# Patient Record
Sex: Female | Born: 1975 | Race: White | Hispanic: No | Marital: Single | Smoking: Current every day smoker
Health system: Southern US, Community
[De-identification: ages and names within clinical notes are randomized; demographics above are authoritative.]

---

## 2016-06-06 ENCOUNTER — Emergency Department
Admission: EM | Admit: 2016-06-06 | Discharge: 2016-06-06 | Disposition: A | Discharge status: Home / Self Care | Payer: Self-pay | Attending: Emergency Medicine | Admitting: Emergency Medicine

## 2016-06-06 ENCOUNTER — Emergency Department: Payer: Self-pay

## 2016-06-06 ENCOUNTER — Encounter: Payer: Self-pay | Admitting: *Deleted

## 2016-06-06 DIAGNOSIS — J069 Acute upper respiratory infection, unspecified: Secondary | ICD-10-CM | POA: Insufficient documentation

## 2016-06-06 DIAGNOSIS — B9789 Other viral agents as the cause of diseases classified elsewhere: Secondary | ICD-10-CM

## 2016-06-06 MED ORDER — HYDROCOD POLST-CPM POLST ER 10-8 MG/5ML PO SUER: 5.0000 mL | Freq: Two times a day (BID) | ORAL | 0 refills | Status: DC

## 2016-06-06 MED ORDER — BENZONATATE 100 MG PO CAPS
ORAL_CAPSULE | ORAL | Status: AC
  Filled 2016-06-06: qty 1

## 2016-06-06 MED ORDER — BENZONATATE 100 MG PO CAPS
200.0000 mg | ORAL_CAPSULE | Freq: Once | ORAL | Status: AC
  Administered 2016-06-06: 200 mg via ORAL
  Filled 2016-06-06: qty 2

## 2016-06-06 MED ORDER — KETOROLAC TROMETHAMINE 60 MG/2ML IM SOLN
60.0000 mg | Freq: Once | INTRAMUSCULAR | Status: AC
  Administered 2016-06-06: 60 mg via INTRAMUSCULAR
  Filled 2016-06-06: qty 2

## 2016-06-06 MED ORDER — NAPROXEN 500 MG PO TABS: 500.0000 mg | ORAL_TABLET | Freq: Two times a day (BID) | ORAL | Status: DC

## 2016-06-06 NOTE — ED Triage Notes (Signed)
States nasal congestion and sore throat for 1 month, states she now has a green productive cough, daily smoker, awake and alert in no acute distress

## 2016-06-06 NOTE — ED Provider Notes (Signed)
[facility] Emergency Department Provider Note   ____________________________________________   First MD Initiated Contact with Patient 06/06/16 1325     (approximate)  I have reviewed the triage vital signs and the nursing notes.   HISTORY  Chief Complaint Cough    HPI Barbara Anthony is a 41 y.o. female patient complaining of one month of intermittent nasal congestion, sore throat and cough. Patient stating the past 3 days of cough has become more productive and greenish in nature. Patient admits to cigarette smoking. Patient states body aches secondary to continue coughing. Patient denies any nausea, vomiting, or diarrhea.   History reviewed. No pertinent past medical history.  There are no active problems to display for this patient.   No past surgical history on file.  Prior to Admission medications   Medication Sig Start Date End Date Taking? Authorizing Provider  chlorpheniramine-HYDROcodone (TUSSIONEX PENNKINETIC ER) 10-8 MG/5ML SUER Take 5 mLs by mouth 2 (two) times daily. 06/06/16   Joni Reining, PA-C  naproxen (NAPROSYN) 500 MG tablet Take 1 tablet (500 mg total) by mouth 2 (two) times daily with a meal. 06/06/16   Joni Reining, PA-C    Allergies Patient has no known allergies.  History reviewed. No pertinent family history.  Social History Social History  Substance Use Topics  . Smoking status: Not on file  . Smokeless tobacco: Not on file  . Alcohol use Not on file    Review of Systems  Constitutional:  fever or chills  Eyes: No visual changes. ENT: Sore throat  Cardiovascular: Denies chest pain. Respiratory: Denies shortness of breath.Productive cough Gastrointestinal: No abdominal pain.  No nausea, no vomiting.  No diarrhea.  No constipation. Genitourinary: Negative for dysuria. Musculoskeletal: Negative for back pain. Skin: Negative for rash. Neurological: Negative for headaches, focal weakness or  numbness.   ____________________________________________   PHYSICAL EXAM:  VITAL SIGNS: ED Triage Vitals [06/06/16 1253]  Enc Vitals Group     BP (!) 131/99     Pulse Rate 98     Resp 16     Temp 98.3 F (36.8 C)     Temp Source Oral     SpO2 97 %     Weight 165 lb (74.8 kg)     Height 5\' 6"  (1.676 m)     Head Circumference      Peak Flow      Pain Score      Pain Loc      Pain Edu?      Excl. in GC?     Constitutional: Alert and oriented. Well appearing and in no acute distress. Eyes: Conjunctivae are normal. PERRL. EOMI. Head: Atraumatic. Nose: No congestion/rhinnorhea. Mouth/Throat: Mucous membranes are moist.  Oropharynx non-erythematous. Neck: No stridor.  No cervical spine tenderness to palpation. Hematological/Lymphatic/Immunilogical: No cervical lymphadenopathy. Cardiovascular: Normal rate, regular rhythm. Grossly normal heart sounds.  Good peripheral circulation. Respiratory: Normal respiratory effort.  No retractions. Lungs CTAB. Productive cough Gastrointestinal: Soft and nontender. No distention. No abdominal bruits. No CVA tenderness. Musculoskeletal: No lower extremity tenderness nor edema.  No joint effusions. Neurologic:  Normal speech and language. No gross focal neurologic deficits are appreciated. No gait instability. Skin:  Skin is warm, dry and intact. No rash noted. Psychiatric: Mood and affect are normal. Speech and behavior are normal.  ____________________________________________   LABS (all labs ordered are listed, but only abnormal results are displayed)  Labs Reviewed - No data to display ____________________________________________  EKG  ____________________________________________  RADIOLOGY  No acute final chest x-ray ____________________________________________   PROCEDURES  Procedure(s) performed: None  Procedures  Critical Care performed: No  ____________________________________________   INITIAL IMPRESSION /  ASSESSMENT AND PLAN / ED COURSE  Pertinent labs & imaging results that were available during my care of the patient were reviewed by me and considered in my medical decision making (see chart for details).  Respiratory infection with cough. Patient given discharge care instructions. Patient given a work note. Patient [facility]      ____________________________________________   FINAL CLINICAL IMPRESSION(S) / ED DIAGNOSES  Final diagnoses:  Viral URI with cough      NEW MEDICATIONS STARTED DURING THIS VISIT:  New Prescriptions   CHLORPHENIRAMINE-HYDROCODONE (TUSSIONEX PENNKINETIC ER) 10-8 MG/5ML SUER    Take 5 mLs by mouth 2 (two) times daily.   NAPROXEN (NAPROSYN) 500 MG TABLET    Take 1 tablet (500 mg total) by mouth 2 (two) times daily with a meal.     Note:  This document was prepared using Dragon voice recognition software and may include unintentional dictation errors.    Joni ReiningSmith, Ronald K, PA-C 06/06/16 1430    Emily FilbertWilliams, Jonathan E, MD 06/06/16 [phone]

## 2019-01-05 IMAGING — CR DG CHEST 2V
1 series · 2 of 2 positions shown · non-contrast
Comparison: 09/07/2007

CLINICAL DATA: Productive cough 1 month

EXAM:
CHEST  2 VIEW

[Series 1: dg chest 2 view · 0.14mm/px · 2 of 2 slices shown]
[im 1/2]
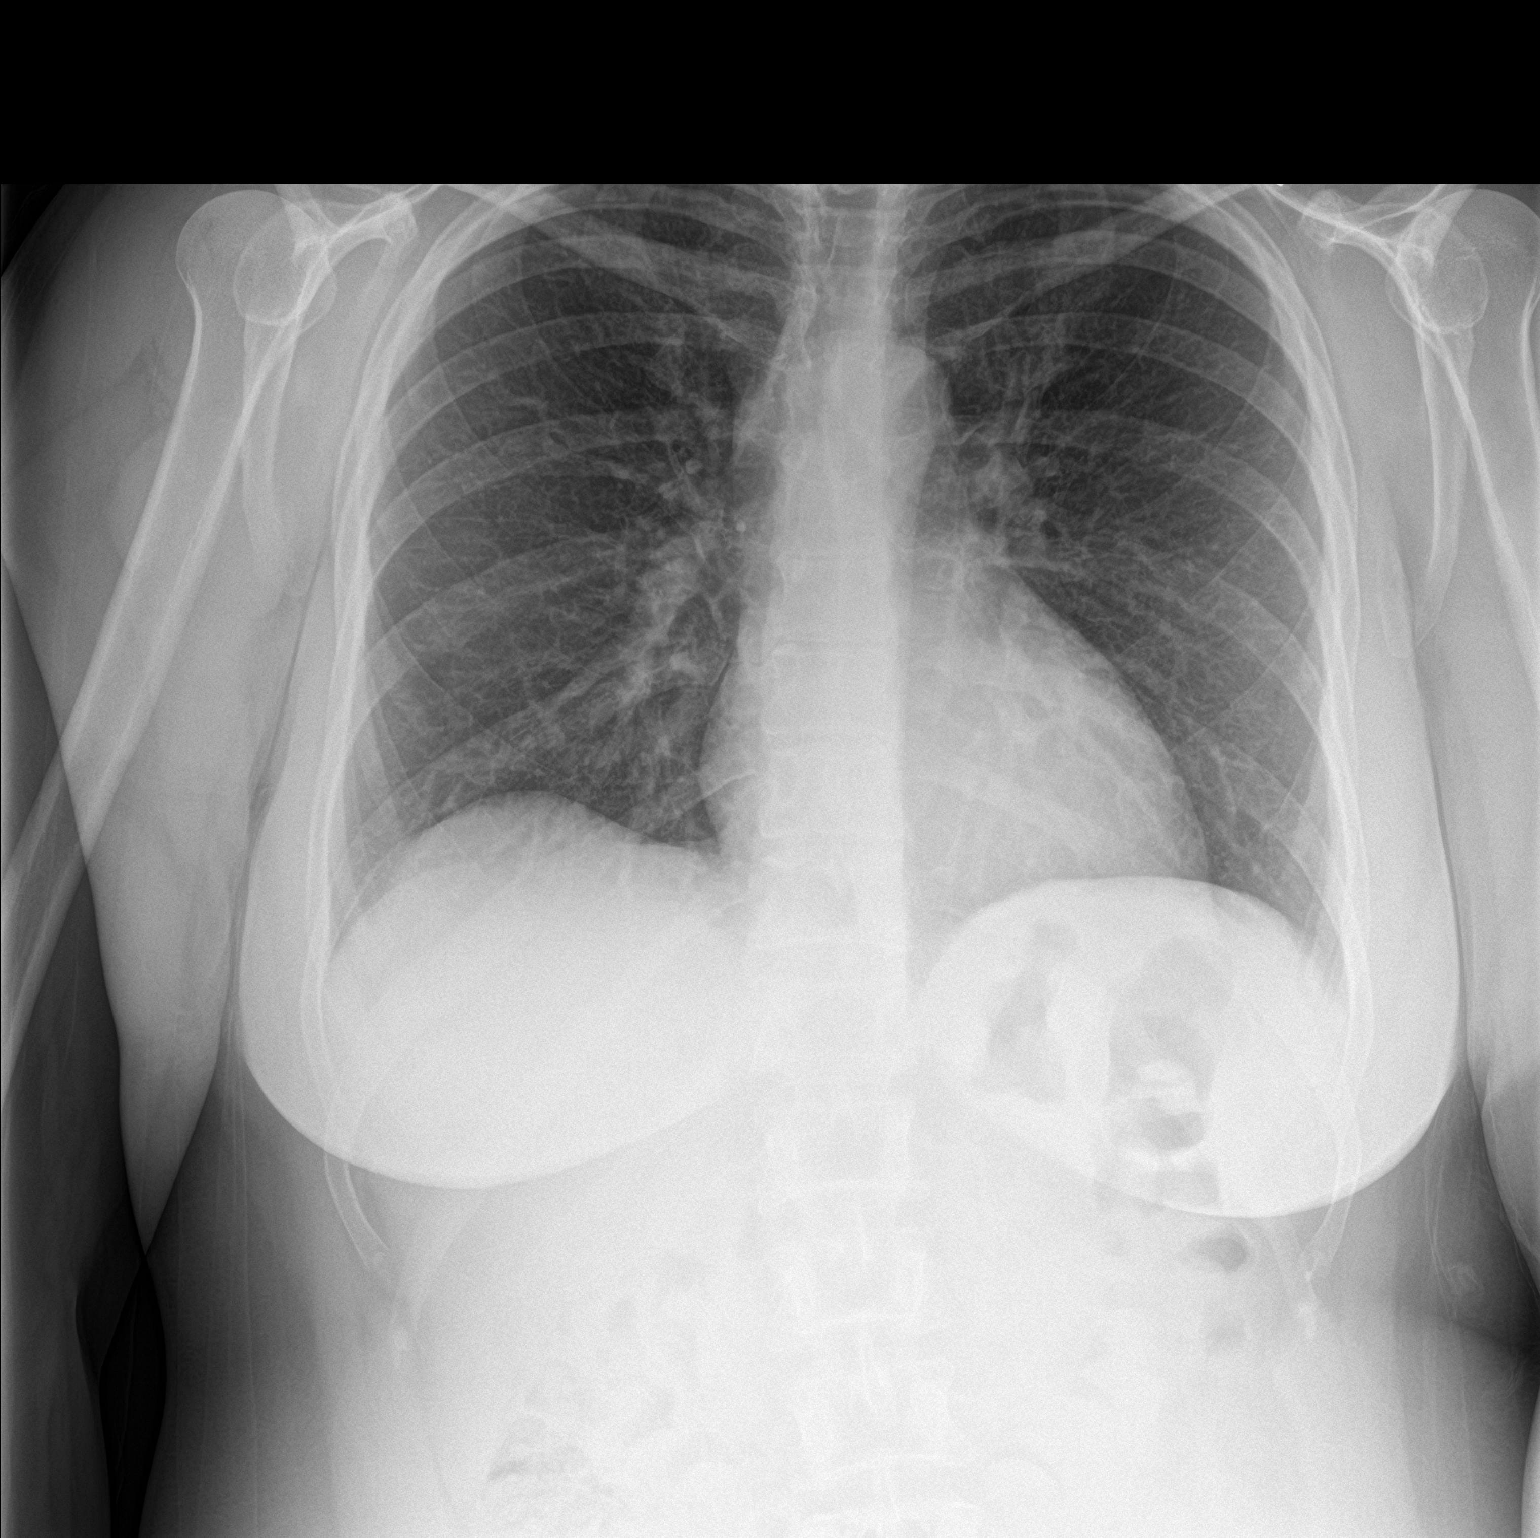
[im 2/2]
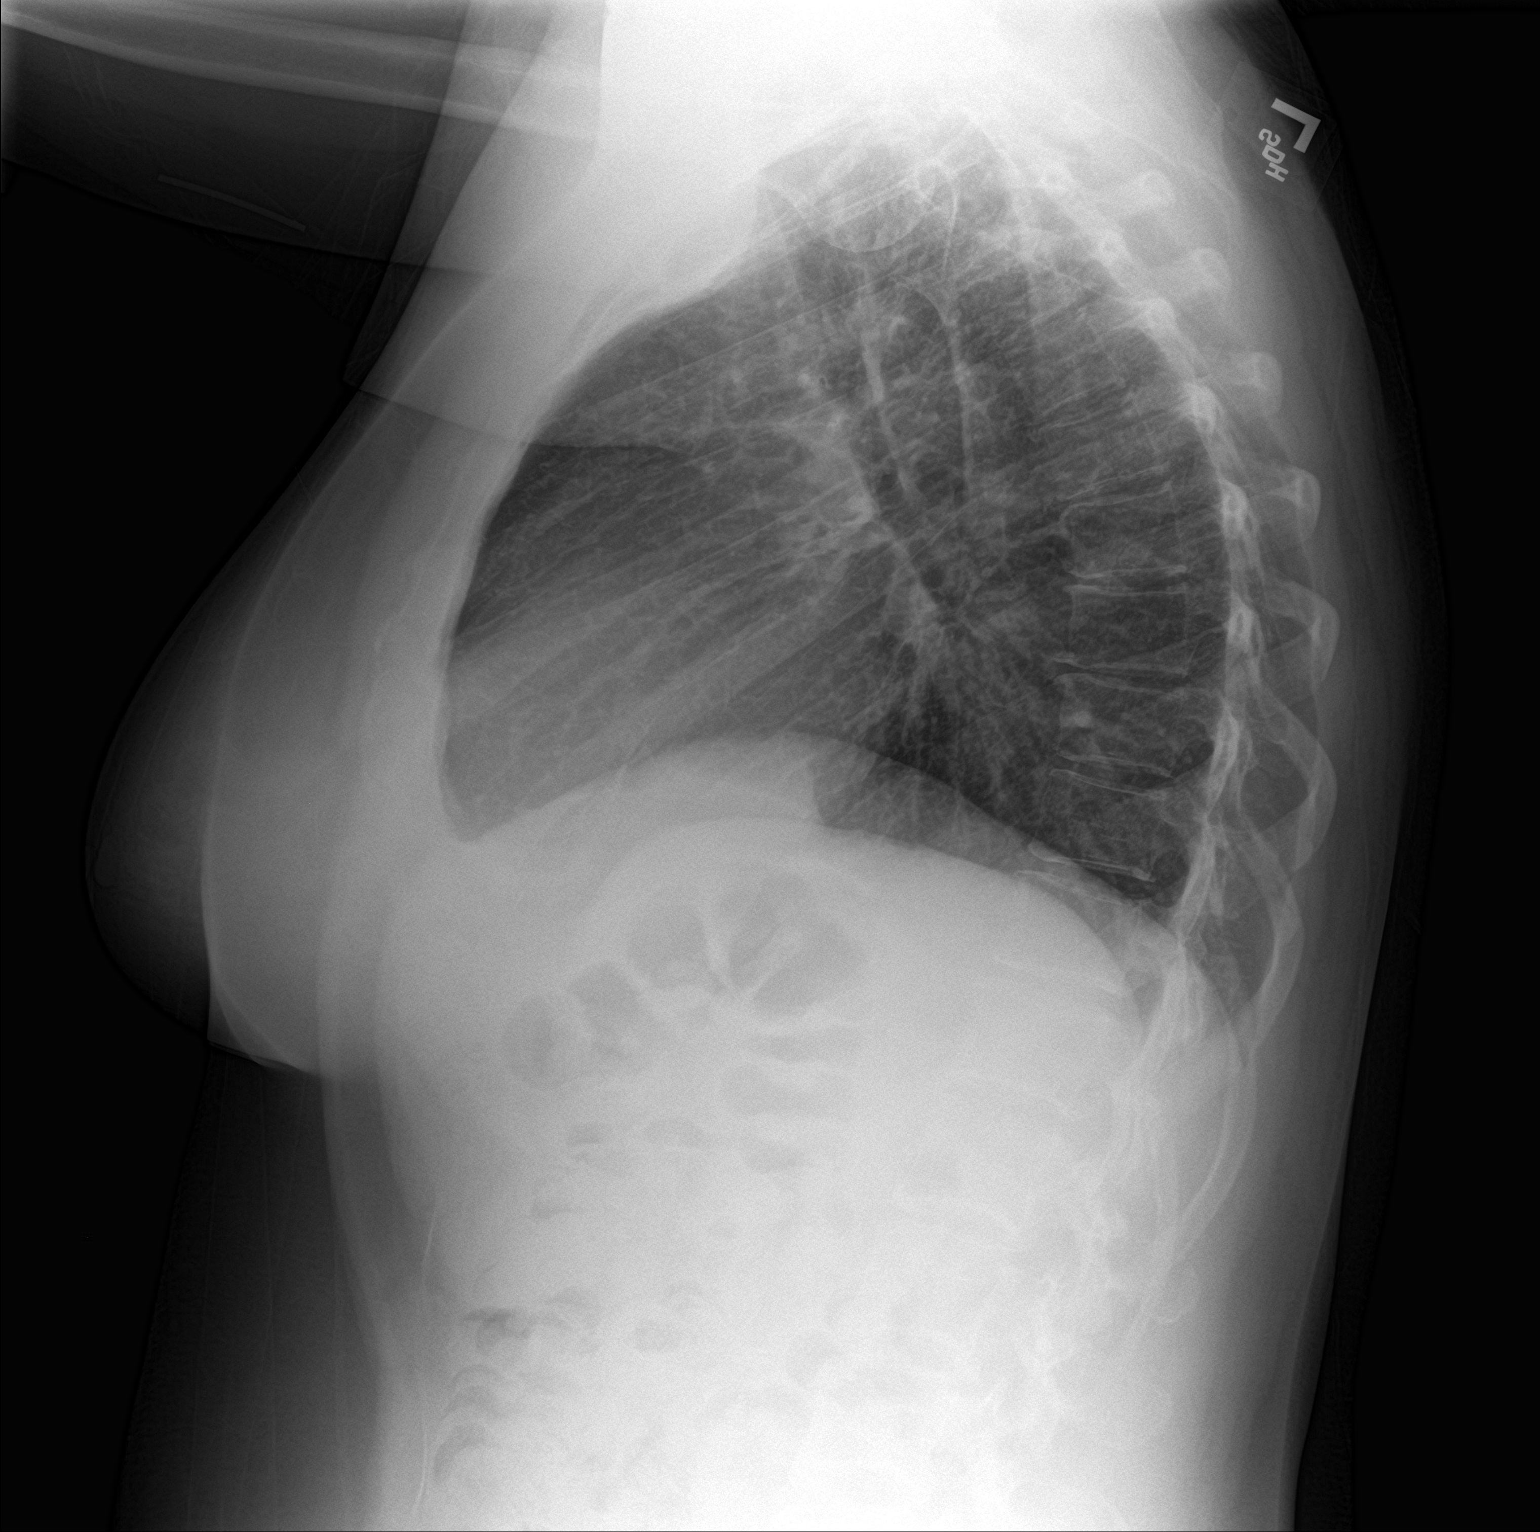

[2 of 2 positions shown; findings below may reference images not displayed]

FINDINGS: The heart size and mediastinal contours are within normal limits.
Both lungs are clear. The visualized skeletal structures are
unremarkable.
IMPRESSION: No active cardiopulmonary disease.

## 2019-09-23 ENCOUNTER — Other Ambulatory Visit: Payer: Self-pay

## 2019-09-23 DIAGNOSIS — Z20822 Contact with and (suspected) exposure to covid-19: Secondary | ICD-10-CM

## 2019-09-26 LAB — NOVEL CORONAVIRUS, NAA: SARS-CoV-2, NAA: NOT DETECTED

## 2021-06-20 DIAGNOSIS — M25462 Effusion, left knee: Secondary | ICD-10-CM | POA: Insufficient documentation

## 2021-08-10 DIAGNOSIS — M179 Osteoarthritis of knee, unspecified: Secondary | ICD-10-CM | POA: Insufficient documentation

## 2021-08-10 DIAGNOSIS — S83249A Other tear of medial meniscus, current injury, unspecified knee, initial encounter: Secondary | ICD-10-CM | POA: Insufficient documentation

## 2022-04-11 ENCOUNTER — Encounter: Payer: Self-pay | Admitting: Podiatry

## 2022-04-11 ENCOUNTER — Ambulatory Visit (INDEPENDENT_AMBULATORY_CARE_PROVIDER_SITE_OTHER): Payer: BC Managed Care – PPO

## 2022-04-11 ENCOUNTER — Ambulatory Visit: Payer: BC Managed Care – PPO | Admitting: Podiatry

## 2022-04-11 DIAGNOSIS — M21612 Bunion of left foot: Secondary | ICD-10-CM | POA: Diagnosis not present

## 2022-04-11 DIAGNOSIS — M21619 Bunion of unspecified foot: Secondary | ICD-10-CM

## 2022-04-11 NOTE — Progress Notes (Signed)
  Subjective:  Patient ID: Barbara Anthony, female    DOB: 02-Jun-1975,   MRN: [id number]  Chief Complaint  Patient presents with   Bunions    Bunion to left foot     47 y.o. female presents for concern of bunion on her left foot that has been there for years. Relates over the years the pain has started to worsen. Does relates she is not in so much pain that she feels like she wants surgery yet . Denies any other pedal complaints. Denies n/v/f/c.   History reviewed. No pertinent past medical history.  Objective:  Physical Exam: Vascular: DP/PT pulses 2/4 bilateral. CFT <3 seconds. Normal hair growth on digits. No edema.  Skin. No lacerations or abrasions bilateral feet.  Musculoskeletal: MMT 5/5 bilateral lower extremities in DF, PF, Inversion and Eversion. Deceased ROM in DF of ankle joint. HAV deformity noted at the left foot with tenderness to the medial eminence. Mild increase in first ray mobility. No pain with ROM of the first MPJ. No pain dorsally or plantarly to the joint.  Neurological: Sensation intact to light touch.   Assessment:   1. Bunion, left foot      Plan:  Patient was evaluated and treated and all questions answered. -Xrays reviewed. Moderative bunion deformity noted with HAV angle of about 13 degrees.  -Discussed HAV and treatment options;conservative and surgical management; risks, benefits, alternatives discussed. All patient's questions answered. -Discussed padding and wide shoe gear.   -Powersteps dispensed  -Recommend continue with good supportive shoes and inserts.  -Discusssed CMO and patient will check with insurance.  -Discussed surgical options. Will hold off until painful enough if ever.   -Patient to return to office as needed or sooner if condition worsens.   Lorenda Peck, DPM

## 2022-05-03 DIAGNOSIS — M5416 Radiculopathy, lumbar region: Secondary | ICD-10-CM | POA: Insufficient documentation

## 2022-05-03 DIAGNOSIS — M1611 Unilateral primary osteoarthritis, right hip: Secondary | ICD-10-CM | POA: Insufficient documentation

## 2022-05-03 DIAGNOSIS — R52 Pain, unspecified: Secondary | ICD-10-CM | POA: Insufficient documentation

## 2022-05-03 DIAGNOSIS — M25551 Pain in right hip: Secondary | ICD-10-CM | POA: Insufficient documentation

## 2022-10-17 ENCOUNTER — Encounter: Payer: Self-pay | Admitting: Podiatry

## 2022-10-17 ENCOUNTER — Ambulatory Visit: Payer: BC Managed Care – PPO | Admitting: Podiatry

## 2022-10-17 DIAGNOSIS — M2012 Hallux valgus (acquired), left foot: Secondary | ICD-10-CM | POA: Diagnosis not present

## 2022-10-17 DIAGNOSIS — Z720 Tobacco use: Secondary | ICD-10-CM | POA: Insufficient documentation

## 2022-10-17 DIAGNOSIS — M461 Sacroiliitis, not elsewhere classified: Secondary | ICD-10-CM | POA: Insufficient documentation

## 2022-10-17 DIAGNOSIS — M5441 Lumbago with sciatica, right side: Secondary | ICD-10-CM | POA: Insufficient documentation

## 2022-10-17 DIAGNOSIS — M21612 Bunion of left foot: Secondary | ICD-10-CM | POA: Diagnosis not present

## 2022-10-17 DIAGNOSIS — G5701 Lesion of sciatic nerve, right lower limb: Secondary | ICD-10-CM | POA: Insufficient documentation

## 2022-10-17 DIAGNOSIS — M533 Sacrococcygeal disorders, not elsewhere classified: Secondary | ICD-10-CM | POA: Insufficient documentation

## 2022-10-17 NOTE — Progress Notes (Signed)
Chief Complaint  Patient presents with   Bunions    Bunion to left foot. Throbbing pains at night time. She is wearing flip flops because close toe shoes cause a lot of pain and pressure.    HPI: 47 y.o. female presents today with c/o left bunion pain.  She was seen back in March, but stated it didn't hurt enough at that time to proceed with surgery.  She is now noting bump pain all the time, and cannot get relief.  The pain is progressively worsening.  The meloxicam she takes for her back isn't helping her bunion pain.  She denies any big changes in bunion or hallux position since last seen.  No injuries reported.  She is now interested in surgical correction.    States that she doesn't have bad reactions to anesthesia.  She notes she did "flatline" when given an epidural in the past, but states it had something to do with her liver at that time (pregnancy) and has never had any issues since then.  She is a Engineer, civil (consulting).  History reviewed. No pertinent past medical history.  History reviewed. No pertinent surgical history.  No Known Allergies   Physical Exam: General: The patient is alert and oriented x3 in no acute distress.  Dermatology: Skin is warm, dry and supple bilateral lower extremities. Interspaces are clear of maceration and debris.  Toenails are elongated  Vascular: Palpable pedal pulses bilaterally. Capillary refill within normal limits.  No appreciable edema.  No erythema or calor.  Neurological: Light touch sensation grossly intact bilateral feet.   Musculoskeletal Exam: Pain on palpation bony prominence on medial left 1st met. Head. Lateral deviation of hallux which is reducible.  No pain or crepitus with ROM of the 1st MPJ left foot.    Assessment/Plan of Care: 1. Hallux valgus with bunions of left foot    Discussed clinical and radiographic findings with patient today.  Offered a cortisone injection to the joint, but she refused at this time.  Since other  conservative measures have been unsuccessful (shoe mod's, spacers, splints, NSAIDs), she wants to proceed with surgery.  Discussed an Austin/Akin-type procedure with screw/staple fixation.  Reviewed p/o course and expectations.  She'll remain home from work the first 2 weeks p/o, then may return with either a walker or knee scooter.  Partial weight bearing okay during p/o period.    Discussed her smoking and she plans on stopping very soon.  Reviewed risks involved with smoking with regard to bone healing and non-unions.  States she has never been diagnosed with osteopenia/osteoporosis.  She can obtain a driver on the day of surgery.    Benefits, risks and possible complications were discussed with the patient while reviewing our surgery forms today.  Overcorrection, undercorrection, infection, Failure of hardware, hematoma, non-union, loss of limb were all discussed, but this list is not all-inclusive.  Her consent form did list other risks of surgery to consider.  Discussed sequelae if she opts not to proceed with surgical intervention.  Verbal and written consent obtained today.  She wants surgery in mid-December.  She has no vacations/travel planned for the p/o period.    She does not need to stop her meloxicam prior to surgery.     Clerance Lav, DPM, FACFAS [facility]     [address]  [place], [place] [address]                Office [phone]  Fax [phone]

## 2022-10-22 ENCOUNTER — Telehealth: Payer: Self-pay | Admitting: Podiatry

## 2022-10-22 NOTE — Telephone Encounter (Signed)
Called to schedule surgery. No answer, no voicemail

## 2022-11-30 ENCOUNTER — Other Ambulatory Visit: Payer: Self-pay | Admitting: Family Medicine

## 2022-11-30 DIAGNOSIS — Z1231 Encounter for screening mammogram for malignant neoplasm of breast: Secondary | ICD-10-CM

## 2022-12-05 ENCOUNTER — Telehealth: Payer: Self-pay | Admitting: Podiatry

## 2022-12-05 NOTE — Telephone Encounter (Signed)
DOS-01/01/2023  [place] BUNIONECTOMY [id number] AIKEN OSTEOTOMY [id number]  [employer] EFFECTIVE DATE-07/30/2022  DEDUCTIBLE- $500.00 WITH REMAINING $500.00 OOP-$1500.00 WITH REMAINING $1342.39 COINSURANCE-20%  SPOKE WITH LATOYA P. FROM [employer] AND SHE STATED THAT PRIOR AUTH IS NOT REQUIRED FOR CPT CODES 19147 AND [phone].  CALL REF #: LATOYA P. 12/05/2022 @ 3:22PM EST

## 2022-12-25 ENCOUNTER — Encounter: Payer: Self-pay | Admitting: Family Medicine

## 2022-12-25 ENCOUNTER — Ambulatory Visit
Admission: RE | Admit: 2022-12-25 | Discharge: 2022-12-25 | Disposition: A | Discharge status: Home / Self Care | Payer: BC Managed Care – PPO | Source: Ambulatory Visit | Attending: Family Medicine | Admitting: Family Medicine

## 2022-12-25 DIAGNOSIS — Z1231 Encounter for screening mammogram for malignant neoplasm of breast: Secondary | ICD-10-CM

## 2022-12-31 ENCOUNTER — Encounter: Payer: Self-pay | Admitting: Podiatry

## 2022-12-31 ENCOUNTER — Other Ambulatory Visit: Payer: Self-pay | Admitting: Podiatry

## 2022-12-31 DIAGNOSIS — M51369 Other intervertebral disc degeneration, lumbar region without mention of lumbar back pain or lower extremity pain: Secondary | ICD-10-CM | POA: Insufficient documentation

## 2022-12-31 MED ORDER — HYDROCODONE-ACETAMINOPHEN 5-325 MG PO TABS: ORAL_TABLET | ORAL | 0 refills | Status: DC

## 2022-12-31 MED ORDER — AMOXICILLIN 500 MG PO CAPS: 500.0000 mg | ORAL_CAPSULE | Freq: Two times a day (BID) | ORAL | 0 refills | Status: AC

## 2023-01-01 DIAGNOSIS — M2012 Hallux valgus (acquired), left foot: Secondary | ICD-10-CM | POA: Diagnosis not present

## 2023-01-01 HISTORY — PX: OTHER SURGICAL HISTORY: SHX169

## 2023-01-10 ENCOUNTER — Encounter: Payer: Self-pay | Admitting: Podiatry

## 2023-01-10 ENCOUNTER — Ambulatory Visit (INDEPENDENT_AMBULATORY_CARE_PROVIDER_SITE_OTHER): Payer: BC Managed Care – PPO

## 2023-01-10 ENCOUNTER — Telehealth: Payer: Self-pay | Admitting: Podiatry

## 2023-01-10 ENCOUNTER — Ambulatory Visit (INDEPENDENT_AMBULATORY_CARE_PROVIDER_SITE_OTHER): Payer: BC Managed Care – PPO | Admitting: Podiatry

## 2023-01-10 DIAGNOSIS — Z9889 Other specified postprocedural states: Secondary | ICD-10-CM

## 2023-01-10 NOTE — Telephone Encounter (Signed)
Completed FMLA document from [employer] for the patient ....   s/w Ms. Gruszka in the [place] office - just finished an appointment with Dr. Burna Mortimer ...  Advised the paperwork will be sent to [employer] today - patient said Dr. Burna Mortimer would be uploading a letter for today's visit and wants ti also sent to [employer] .Marland Kitchen...    Faxed all docs to [employer] -- [phone] .Marland Kitchen...    J. Abbott -- 01/10/2023

## 2023-01-13 ENCOUNTER — Encounter: Payer: Self-pay | Admitting: Podiatry

## 2023-01-13 NOTE — Progress Notes (Signed)
   Chief Complaint  Patient presents with   Routine Post Op   HPI: 47 y.o. female presents today for her first postoperative visit today.  She is using a knee scooter and has been mostly nonweightbearing left foot.  She states that she has no pain and is very pleased.  Denies injury to the foot since surgery.  She has a surgical shoe in place in the original surgical dressing is intact and clean and dry.  Denies fever, chills and night sweats.  Denies shortness of breath or chest pain.  Notes that she has some discomfort in her left calf but states that she is a Engineer, civil (consulting) and knows that this is not the type of discomfort seen with DVT.  History reviewed. No pertinent past medical history.  Past Surgical History:  Procedure Laterality Date   left bunionectomy with osteotomy Left 01/01/2023   No Known Allergies   Physical Exam: Upon removal of the dressing there is dried blood drainage on the dressing.  Steri-Strips are intact and the incision is reapproximated well.  No active drainage is noted.  Pedal pulses are palpable.  There is localized edema and ecchymosis.  The first ray is in rectus position.  Light touch sensation is intact to the tips of all toes.  Radiographic Exam (left foot, 2 weightbearing views, 01/10/2023):  Normal osseous mineralization.  First MP joint is congruent.  Internal fixation is intact.  Osteotomy sites can be seen and are in correct position.  First intermetatarsal angle has been corrected.  Sesamoids are in good position  Assessment/Plan of Care: 1. Post-operative state    Discussed clinical and radiographic findings with patient today.  The incision was redressed today with Xeroform gauze and a dry sterile dressing was applied.  This is followed by an Ace wrap.  She will continue with minimal weightbearing on the left foot and keep the dressing clean, dry, and intact.  She was encouraged to keep elevating above the level of the heart and use her ice pack.  At  the end of her appointment she noted that she has been frustrated with our main office and lack of obtaining her completed FMLA paperwork.  She wanted the [facility] office contacted during her appointment to make sure her FMLA paperwork will be completed today and sent to her employer.  She notes that she has not been paid for her previous 2 weeks of work because they stated that they never received the paperwork from our office.  The medical assistant helped facilitate this for the patient today.  Follow-up in 1 week for recheck and replacement of Steri-Strips   Cydnee Fuquay DBurna Mortimer, DPM, FACFAS [facility]     [address] [place], [place] [address]                Office [phone]  Fax [phone]

## 2023-01-17 ENCOUNTER — Ambulatory Visit (INDEPENDENT_AMBULATORY_CARE_PROVIDER_SITE_OTHER): Payer: BC Managed Care – PPO | Admitting: Podiatry

## 2023-01-17 DIAGNOSIS — Z9889 Other specified postprocedural states: Secondary | ICD-10-CM

## 2023-01-20 ENCOUNTER — Encounter: Payer: Self-pay | Admitting: Podiatry

## 2023-01-20 NOTE — Progress Notes (Signed)
      Chief Complaint  Patient presents with   Routine Post Op    2 week post op, lft ft. No pain,    HPI: 47 y.o. female presents today for her second postoperative appointment.  She is very pleased noting that she does not have any pain to the surgical area.  She has been minimal to nonweightbearing on the surgical foot.  Denies injury during the postoperative period.  Denies fever, chest pain, shortness of breath, nausea or vomiting.  History reviewed. No pertinent past medical history.  Past Surgical History:  Procedure Laterality Date   left bunionectomy with osteotomy Left 01/01/2023   No Known Allergies   Physical Exam: Palpable pedal pulses to the left foot.  Mild localized edema to the surgical area.  Light touch sensation intact to all toes.  Foot and first ray are in corrected/rectus position.  Steri-Strips are intact with dried blood present.  Incision is coapting well.  Assessment/Plan of Care: 1. Post-operative state    Discussed clinical findings with patient today.  The Steri-Strips were replaced today.  The suture ends were cut and removed.  The remaining Monocryl subcuticular stitch will absorb on its own.  The area was dressed with antibiotic ointment and a dry sterile dressing was applied followed by an Ace wrap.  She will continue with the surgical shoe at all times weightbearing.  Continue with ice and elevation.  Follow-up at 4 weeks postop for x-rays and evaluation of osteotomy healing.   Clerance Lav, DPM, FACFAS [facility]     [address] [place], [place] [address]                Office [phone]  Fax [phone]

## 2023-01-24 ENCOUNTER — Telehealth: Payer: Self-pay | Admitting: Podiatry

## 2023-01-24 NOTE — Telephone Encounter (Signed)
(  See 01/10/23 note) ...   Received paperwork from [facility] - needed more clarity on RTW date ....   Completed the revised paperwork and faxed to [facility] @ [phone] ...  Called patient & LMVM and advised same .Marland Kitchen...     J. Abbott -- 01/24/2023

## 2023-01-28 ENCOUNTER — Telehealth: Payer: Self-pay | Admitting: Podiatry

## 2023-01-28 NOTE — Telephone Encounter (Signed)
Pt called stating she has gotten a blister in between her second toe, there is some pus/drainage coming out. She would like to know if that is something that needs to be seen before her appt on 12/2.

## 2023-01-28 NOTE — Telephone Encounter (Signed)
Pt is scheduled for 12/31 at 9:45

## 2023-01-29 ENCOUNTER — Ambulatory Visit: Payer: BC Managed Care – PPO | Admitting: Podiatry

## 2023-01-29 ENCOUNTER — Encounter: Payer: Self-pay | Admitting: Podiatry

## 2023-01-29 VITALS — Ht 66.0 in | Wt 165.0 lb

## 2023-01-29 DIAGNOSIS — B353 Tinea pedis: Secondary | ICD-10-CM

## 2023-01-29 MED ORDER — ECONAZOLE NITRATE 1 % EX CREA: TOPICAL_CREAM | Freq: Two times a day (BID) | CUTANEOUS | 2 refills | Status: AC

## 2023-01-29 NOTE — Progress Notes (Signed)
      Chief Complaint  Patient presents with   Routine Post Op    RM17: POV # 3 DOS 01/01/23 --- SURGICAL BUNIONECTOMY WITH DOUBLE OSTEOTOMY LEFT FOOT USING INTERNAL FIXATION   Wound Check    Patient has small crack between 2nd and 3rd toe on left foot states that it is itchy and smelly, surgical incision look good healing well   HPI: 47 y.o. female presents today after calling to request this appointment during her postoperative course, with a separate concern of a small crack between the second and third toes on the left foot.  Patient states that this is extremely itchy and smells bad.  As she denies injury to the area.  As she is not able to obtain relief with anything at home.  States that the area is wet between the toes  History reviewed. No pertinent past medical history.  Past Surgical History:  Procedure Laterality Date   left bunionectomy with osteotomy Left 01/01/2023   No Known Allergies  Review of Systems  Skin:  Positive for itching.     Physical Exam: On examination there are palpable pedal pulses left foot.  The surgical dressing was removed to assess the surgical site which has no significant changes from last visit.  Attention was then directed to the left second interspace in which there was a small fissure noted in the interspace, with macerated tissue and erythema and evidence of recent scant serous buildup, which is starting to crust in the area.  This is consistent with acute tinea pedis.  No purulence is noted  Assessment/Plan of Care: 1. Tinea pedis of left foot      Meds ordered this encounter  Medications   econazole nitrate  1 % cream    Sig: Apply topically 2 (two) times daily. Apply between affected toes twice daily    Dispense:  15 g    Refill:  2   Discussed clinical findings with patient today.  Informed the patient that she has an acute case of athlete's foot between the second and third toes on the left foot.  As she needs to try to keep the  area dry between the toes.  She may use lambswool interlaced between all of the toes to help allow more airflow.  She needs to use a clean, dry cloth to dry well between the toes daily.  Castellani's paint was applied between the second and third toes on the left foot today.    The surgical area was redressed with nonadherent primary dressing followed by a dry sterile gauze dressing and an Ace wrap.  Prescription for econazole nitrate  1% cream was sent to the patient's pharmacy to apply between the affected toes twice daily.  If this does not provide improvement starting within the next day or 2 she is to call the office.  Do not want this spreading or coming in contact with the surgical area.  She expressed understanding.  Follow-up as scheduled for next postop appointment   Kewanda Poland CHARM Imperial, DPM, FACFAS [facility]     [address] [place], [place] [address]                Office [phone]  Fax [phone]

## 2023-01-31 ENCOUNTER — Encounter: Payer: BC Managed Care – PPO | Admitting: Podiatry

## 2023-02-05 ENCOUNTER — Ambulatory Visit (INDEPENDENT_AMBULATORY_CARE_PROVIDER_SITE_OTHER): Payer: BC Managed Care – PPO | Admitting: Podiatry

## 2023-02-05 ENCOUNTER — Ambulatory Visit (INDEPENDENT_AMBULATORY_CARE_PROVIDER_SITE_OTHER): Payer: BC Managed Care – PPO

## 2023-02-05 ENCOUNTER — Encounter: Payer: Self-pay | Admitting: Podiatry

## 2023-02-05 VITALS — Ht 66.0 in | Wt 165.0 lb

## 2023-02-05 DIAGNOSIS — Z9889 Other specified postprocedural states: Secondary | ICD-10-CM | POA: Diagnosis not present

## 2023-02-05 DIAGNOSIS — B353 Tinea pedis: Secondary | ICD-10-CM

## 2023-02-05 NOTE — Progress Notes (Signed)
      Chief Complaint  Patient presents with   Routine Post Op    4 week Post op & recheck 2nd interspace tinea   HPI: 48 y.o. Anthony presents today for postop check.  She is 4 weeks status post left bunion correction.  She had developed a fungal infection between the second and third toes on the left foot.  She has been using the prescription antifungal cream that was sent in on her last visit.  She notes that this has improved but she has intense itching on the plantar aspect of her left foot.  She has not been able to access the area because of the dressing to be left on.  History reviewed. No pertinent past medical history.  Past Surgical History:  Procedure Laterality Date   left bunionectomy with osteotomy Left 01/01/2023   No Known Allergies   Physical Exam: Palpable pedal pulses left foot.  Mild localized edema to the surgical area.  The skin is very dry and scaly and peeling.  There is a small blister on the plantar aspect of the left foot with clear serous fluid inside.  The interspaces are intact with skin and no evidence of raw skin is noted between the toes.  This is improved from last visit  Radiographic Exam (left foot, 3 weightbearing views, 02/05/2023):  Normal osseous mineralization.  The first ray is in rectus position.  The first MPJ is congruent.  The fixation in the first metatarsal and first proximal phalanx are intact with no evidence of backing out.  The osteotomy sites are healing well.  Assessment/Plan of Care: 1. Tinea pedis of left foot   2. Post-operative state    Discussed clinical and radiographic findings with patient today.  Patient informed that the skin between the toes where she is applying the econazole cream is improving.  Now that we can keep the dressing off at home, she will start applying this to the entire plantar aspect of her foot..  The blister on the plantar aspect of the foot was incised and drained today.  Clear serous fluid was drained.   The antifungal cream was applied to this area.  She noted immediate improvement of her itching once this was applied.  A light for surgical dressing was applied to the left foot but she can remove and start showering.  She will dry the skin roll well after bathing and continue with the econazole cream to the left foot twice daily.  Follow-up in 2 weeks.  She was instructed to begin increasing weightbearing activity since the x-rays show good healing of the osteotomy sites.   Awanda CHARM Imperial, DPM, FACFAS [facility]     [address] [place], [place] [address]                Office [phone]  Fax [phone]

## 2023-02-13 ENCOUNTER — Telehealth: Payer: Self-pay | Admitting: Podiatry

## 2023-02-13 NOTE — Telephone Encounter (Signed)
 Received FMLA paperwork from [facility] ....   Last visit was 01/29/2023 at which time Mr. McCaughan gave a doctors note to the patient (dated 01/29/23) -- she is able to return to [facility] [place].  The next visit is scheduled for 02/19/2023.  Called patient @ hm# (cell did not have VM) and LMVM for more info to complete the paperwork  ....    J. Abbott -- 02/13/2023

## 2023-02-14 ENCOUNTER — Telehealth: Payer: Self-pay | Admitting: Podiatry

## 2023-02-14 NOTE — Telephone Encounter (Signed)
Patient called for a copy of Dr. Altamease Oiler letter, dated 01/10/2023.  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   Placed a copy of the letter at the check-in area [facility]) today and added that her next appointment was 02/19/2023 .Marland Kitchen...   J. Abbott -- 02/14/2023

## 2023-02-19 ENCOUNTER — Encounter: Payer: Self-pay | Admitting: Podiatry

## 2023-02-19 ENCOUNTER — Ambulatory Visit (INDEPENDENT_AMBULATORY_CARE_PROVIDER_SITE_OTHER): Payer: BC Managed Care – PPO

## 2023-02-19 ENCOUNTER — Ambulatory Visit (INDEPENDENT_AMBULATORY_CARE_PROVIDER_SITE_OTHER): Payer: BC Managed Care – PPO | Admitting: Podiatry

## 2023-02-19 VITALS — Ht 66.0 in | Wt 165.0 lb

## 2023-02-19 DIAGNOSIS — Z9889 Other specified postprocedural states: Secondary | ICD-10-CM | POA: Diagnosis not present

## 2023-02-19 NOTE — Progress Notes (Unsigned)
       Chief Complaint  Patient presents with   Routine Post Op    left bunionectomy with osteotomy , 2 WK F/U Walking on it now some pain but generally doing well    HPI: 48 y.o. female presents today after undergoing a left bunionectomy with double osteotomy on 01/01/2023.  She had developed an acute case of interdigital tinea pedis on the surgical foot between the second and third toes.  Patient has been applying the prescribed medication to this area and states that it is almost completely resolved at this time and is pleased.  She has been keeping her dressing clean and dry.  She is still wearing her surgical shoe and using her knee scooter  History reviewed. No pertinent past medical history.  Past Surgical History:  Procedure Laterality Date   left bunionectomy with osteotomy Left 01/01/2023   No Known Allergies   Physical Exam: There are palpable pedal pulses noted.  There is mild localized edema to the surgical area on the left foot.  The incision is well coapted.  Some Steri-Strips are still in place.  These were removed to confirm healing of the incision.  The first ray is in rectus position.  There is some stiffness with range of motion to the first MPJ.  There is minimal maceration in the left second interspace but the erythema and edema to this area have resolved.  Epicritic sensation is intact  Radiographic Exam (left foot, 3 weightbearing views, 02/19/2023):  Normal osseous mineralization.  The hardware is intact in the first metatarsal head as well as the first proximal phalanx.  The joint is congruent at the first MPJ.  There is adequate evidence of bone healing noted at the osteotomy sites.  Assessment/Plan of Care: 1. Post-operative state     Discussed clinical findings with patient today.  Reviewed the radiographs with the patient today.  Will have her start full weightbearing to the left foot.  Since she has been mostly nonweightbearing with a scooter, we will have  her practice full weightbearing in the surgical shoe and then slowly wean into a roomy sneaker that has support.  She may need to go back and forth between the surgical shoe and her regular shoe for at least 1 week getting adjusted to range of motion in the toe again.  Will have her start doing active range of motion of the toes as well as towel scrunches over the next 2 weeks.  Will reevaluate at that time.  At next visit will evaluate for need of any physical therapy.   Clerance Lav, DPM, FACFAS [facility]     [address] [place], [place] [address]                Office [phone]  Fax [phone]

## 2023-03-06 ENCOUNTER — Ambulatory Visit: Payer: BC Managed Care – PPO | Admitting: Podiatry

## 2023-03-27 ENCOUNTER — Telehealth: Payer: Self-pay | Admitting: Podiatry

## 2023-03-27 ENCOUNTER — Ambulatory Visit (INDEPENDENT_AMBULATORY_CARE_PROVIDER_SITE_OTHER): Payer: BC Managed Care – PPO | Admitting: Podiatry

## 2023-03-27 ENCOUNTER — Encounter: Payer: Self-pay | Admitting: Podiatry

## 2023-03-27 DIAGNOSIS — Z9889 Other specified postprocedural states: Secondary | ICD-10-CM

## 2023-03-27 NOTE — Progress Notes (Unsigned)
       Subjective:  Patient ID: Barbara Anthony, female    DOB: 1975/06/28,  MRN: [id number]  [name] presents to clinic today for:  Chief Complaint  Patient presents with   POV    POV for left foot. She is still having some numbness and still cannot bend the toe well. SX site looks good. Not diabetic and no anti coag. She has not started the lamisil yet, she is waiting on Liver function test.    Patient is 11 weeks postop after undergoing a left bunionectomy with osteotomy.  Date of surgery was 01/01/2023.  Patient denies fever, chills, night sweats, nausea/vomiting.  Patient denies chest pain or shortness of breath.  Patient denies calf pain.  She notes some stiffness in the great toe joint.  she notes the interdigital tinea infection seems resolved now.  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  States that she just has not been feeling well thinking about all this.  No past medical history on file.  Past Surgical History:  Procedure Laterality Date   left bunionectomy with osteotomy Left 01/01/2023    No Known Allergies  Objective:  Physical Examination: There are palpable pedal pulses.  There is localized edema to the surgical area.  The incision is well-approximated with no signs of keloid or hypertrophy of scar.  There are some adhesions of the incision to the deeper soft tissue structures.  There is improved range of motion of the first MPJ but still stiff with manipulation.  No erythema is noted.  Minimal pain on palpation of the surgical area.  No clinical signs of infection are seen.  Radiographic Examination (3 views, left foot, 03/27/2023): The first MPJ is congruent.  The internal fixation is in proper position.  Good radiographic healing noted across the osteotomy sites along the first metatarsal and first proximal phalanx.   No evidence of osteolysis is noted.  Assessment/Plan: 1. Post-operative state    Reviewed some massage techniques and range of motion exercises that the patient needs to start performing at home at this time since the skin eruption/tinea pedis infection of the interdigital spaces seems to have improved/resolved.  I offered to send the patient to physical therapy but she declined at this time and wants to try this herself at home.  I will keep her out of work for a few more weeks and have her return to work around March 18.  She will continue with weightbearing activities and regular shoe gear to slowly increase ambulation and exercise.  Patient to call the office in a couple weeks if she is not having any further improvement and needs to either be re-evaluated or referred to PT  Clerance Lav, DPM, FACFAS [facility]     [address] [place], [place] [address]                Office [phone]  Fax [phone]

## 2023-03-27 NOTE — Telephone Encounter (Signed)
 Per request from Dr. Burna Mortimer, compiled letter for patient MyChart to return to work 04/16/2023 without restrictions ......    J. Abbott -- 03/27/2023

## 2023-03-29 ENCOUNTER — Encounter: Payer: Self-pay | Admitting: Podiatry

## 2023-05-06 ENCOUNTER — Ambulatory Visit (INDEPENDENT_AMBULATORY_CARE_PROVIDER_SITE_OTHER)

## 2023-05-06 ENCOUNTER — Ambulatory Visit (INDEPENDENT_AMBULATORY_CARE_PROVIDER_SITE_OTHER): Admitting: Podiatry

## 2023-05-06 ENCOUNTER — Encounter: Payer: Self-pay | Admitting: Podiatry

## 2023-05-06 ENCOUNTER — Other Ambulatory Visit: Payer: Self-pay | Admitting: Podiatry

## 2023-05-06 DIAGNOSIS — Z9889 Other specified postprocedural states: Secondary | ICD-10-CM

## 2023-05-06 DIAGNOSIS — M722 Plantar fascial fibromatosis: Secondary | ICD-10-CM

## 2023-05-06 DIAGNOSIS — M2012 Hallux valgus (acquired), left foot: Secondary | ICD-10-CM

## 2023-05-06 DIAGNOSIS — M79671 Pain in right foot: Secondary | ICD-10-CM

## 2023-05-06 DIAGNOSIS — M7918 Myalgia, other site: Secondary | ICD-10-CM | POA: Insufficient documentation

## 2023-05-06 DIAGNOSIS — M21612 Bunion of left foot: Secondary | ICD-10-CM | POA: Diagnosis not present

## 2023-05-06 MED ORDER — MELOXICAM 15 MG PO TABS: 15.0000 mg | ORAL_TABLET | Freq: Every day | ORAL | 1 refills | Status: AC

## 2023-05-06 NOTE — Progress Notes (Unsigned)
      Chief Complaint  Patient presents with   Foot Pain    Right heel pain for about 2 weeks    HPI: 48 y.o. female presents today with concern of pain in the right heel.  She 4 months status post left bunionectomy with osteotomy.  She is still on her surgical shoe, but notes it is completely worn down.  She notes that recently she assisted with helping retrieve a calf from a pond.  She is not sure if she has been putting more weight on the right foot due to the left postop status, but notes no bruising or trauma to the right heel.  Pain is present with for steps in the morning and with prolonged weightbearing.  History reviewed. No pertinent past medical history.  Past Surgical History:  Procedure Laterality Date   left bunionectomy with osteotomy Left 01/01/2023   No Known Allergies   Physical Exam: Palpable pedal pulses bilateral.  Minimal localized edema to the left forefoot at the surgical area.  No signs of hypertrophy or keloid formation of the scar.  Toe is in rectus position.  There is pain on palpation to the plantar medial and plantar central portions of the right heel as well as extending into the instep.  No pain on palpation of the posterior tibial tendon.  Negative Tinel's sign with percussion of the posterior tibial nerve.  Ankle dorsiflexion less than 10 degrees with the knee extended.  Radiographic Exam (right foot, 3 weightbearing views, 05/06/2023):  Normal osseous mineralization. Joint spaces preserved.  No fracture noted.  There is a small inferior calcaneal spur present.  Mild increase in first and metatarsal angle.  Assessment/Plan of Care: 1. Right foot pain   2. Plantar fasciitis of right foot   3. Hallux valgus with bunions of left foot      Meds ordered this encounter  Medications   meloxicam (MOBIC) 15 MG tablet    Sig: Take 1 tablet (15 mg total) by mouth daily.    Dispense:  30 tablet    Refill:  1   AMB REFERRAL TO PHYSICAL THERAPY  Discussed  clinical and radiographic findings with patient today.  Patient will be referred to physical therapy for the right plantar fasciitis but also for postop rehab for the left bunionectomy with osteotomy.  She will be referred to [facility] in [place].  With the patient's consent a corticosteroid injection was administered to the plantar aspect of the right heel consisting of a mixture of 1% lidocaine plain, 0.5% Marcaine plain, and Kenalog 10 for total of 1.5 cc administered.  A Band-Aid was applied and she tolerated this well.  Stretching exercises were added to patient instructions which should be accessible via MyChart  Her prescription for meloxicam 15 mg 1 tablet p.o. daily was sent in to her pharmacy.  She was dispensed a new surgical shoe as a courtesy today.  Follow-up in 4 to 5 weeks or as needed   Clerance Lav, DPM, FACFAS [facility]     [address] [place], [place] [address]                Office [phone]  Fax [phone]

## 2023-05-09 ENCOUNTER — Encounter: Payer: Self-pay | Admitting: Podiatry

## 2023-05-09 NOTE — Patient Instructions (Signed)

## 2023-05-15 ENCOUNTER — Encounter: Payer: Self-pay | Admitting: Podiatry

## 2023-05-15 ENCOUNTER — Telehealth: Payer: Self-pay | Admitting: Podiatry

## 2023-05-15 MED ORDER — METHYLPREDNISOLONE 4 MG PO TBPK: ORAL_TABLET | ORAL | 0 refills | Status: DC

## 2023-05-15 NOTE — Telephone Encounter (Signed)
 Patient called today stating she had an injection last visit and she is having a lot of pain with it and she is concerned. She is not sure if she needs to be seen again or what to to.

## 2023-05-16 NOTE — Telephone Encounter (Signed)
 I called and left a message to get further information.

## 2023-05-16 NOTE — Telephone Encounter (Signed)
 Provider has sent mychart message and closing encounter.

## 2023-05-31 ENCOUNTER — Ambulatory Visit (INDEPENDENT_AMBULATORY_CARE_PROVIDER_SITE_OTHER): Admitting: Podiatry

## 2023-05-31 DIAGNOSIS — M722 Plantar fascial fibromatosis: Secondary | ICD-10-CM | POA: Diagnosis not present

## 2023-05-31 MED ORDER — PREDNISONE 20 MG PO TABS: ORAL_TABLET | ORAL | 0 refills | Status: AC

## 2023-05-31 NOTE — Progress Notes (Unsigned)
     Chief Complaint  Patient presents with   Foot Pain    Here today for the exact same heel pain as three weeks ago. She did not get the medrol  but states if you call it in she will absolutely try it.  She really wants to hold off on the injection today because she has to be on her feet all day tomorrow.   Not diabetic and no anti coag.     HPI: 48 y.o. female presents today with continued pain in the plantar aspect of the right heel.  She feels that the cortisone injection may have aggravated her pain.  She did not get any relief from it.  When asked if the patient took the Medrol  Dosepak that had been sent and when she called, she states that she never got the message that it had been sent in and never picked that up.  She notes that she has an event tomorrow where she will be standing all day.  She is currently going to physical therapy for her left bunion correction rehab  History reviewed. No pertinent past medical history.  Past Surgical History:  Procedure Laterality Date   left bunionectomy with osteotomy Left 01/01/2023    No Known Allergies    Physical Exam: Palpable pedal pulses noted.  No erythema or calor noted to the right heel.  There is pain on palpation to the plantar medial, plantar central aspects of the right heel.  Minimal plantar lateral heel pain.  Ankle dorsiflexion aggravates the pain.  Epicritic sensation is intact  Assessment/Plan of Care: 1. Plantar fasciitis of right foot      Meds ordered this encounter  Medications   predniSONE (DELTASONE) 20 MG tablet    Sig: Take 2 tablets (40 mg total) by mouth daily with breakfast for 3 days, THEN 1 tablet (20 mg total) daily with breakfast for 3 days, THEN 0.5 tablets (10 mg total) daily with breakfast for 3 days.    Dispense:  10.5 tablet    Refill:  0   Discussed clinical findings with patient today.  Since her Medrol  Dosepak order is expired, will send in a tapered prednisone pack for her to begin taking.   Will hold off on a second cortisone injection since she did not have a good outcome from the last 1.  She was fitted for a plantar fascia brace to wear at all times weightbearing for the right foot.  Patient signed an Soil scientist today upon dispensing the product.  Patient given an new PT order to add evaluation and treatment for the right plantar fasciitis pain to go along with her current order for the left bunion surgery correction rehab.   Joe Murders, DPM, FACFAS [facility]     [address] [place], [place] [address]                Office [phone]  Fax [phone]

## 2023-06-02 ENCOUNTER — Encounter: Payer: Self-pay | Admitting: Podiatry

## 2023-12-30 ENCOUNTER — Other Ambulatory Visit: Payer: Self-pay | Admitting: Family Medicine

## 2023-12-30 DIAGNOSIS — Z1231 Encounter for screening mammogram for malignant neoplasm of breast: Secondary | ICD-10-CM

## 2024-01-03 ENCOUNTER — Encounter

## 2024-01-03 DIAGNOSIS — Z1231 Encounter for screening mammogram for malignant neoplasm of breast: Secondary | ICD-10-CM
